# Patient Record
Sex: Female | Born: 1980 | Race: White | Hispanic: No | Marital: Married | Smoking: Never smoker
Health system: Southern US, Community
[De-identification: ages and names within clinical notes are randomized; demographics above are authoritative.]

## PROBLEM LIST (undated history)

## (undated) DIAGNOSIS — Z789 Other specified health status: Secondary | ICD-10-CM

## (undated) DIAGNOSIS — R569 Unspecified convulsions: Secondary | ICD-10-CM

## (undated) DIAGNOSIS — R51 Headache: Secondary | ICD-10-CM

---

## 2012-03-16 LAB — OB RESULTS CONSOLE HEPATITIS B SURFACE ANTIGEN: Hepatitis B Surface Ag: NEGATIVE

## 2012-06-20 ENCOUNTER — Encounter (HOSPITAL_COMMUNITY): Payer: Self-pay | Admitting: *Deleted

## 2012-06-20 ENCOUNTER — Inpatient Hospital Stay (HOSPITAL_COMMUNITY)
Admission: AD | Admit: 2012-06-20 | Discharge: 2012-06-20 | Disposition: A | Discharge status: Home / Self Care | Payer: BC Managed Care – PPO | Source: Ambulatory Visit | Attending: Obstetrics & Gynecology | Admitting: Obstetrics & Gynecology

## 2012-06-20 DIAGNOSIS — O99891 Other specified diseases and conditions complicating pregnancy: Secondary | ICD-10-CM | POA: Insufficient documentation

## 2012-06-20 DIAGNOSIS — R109 Unspecified abdominal pain: Secondary | ICD-10-CM | POA: Insufficient documentation

## 2012-06-20 DIAGNOSIS — O9A212 Injury, poisoning and certain other consequences of external causes complicating pregnancy, second trimester: Secondary | ICD-10-CM

## 2012-06-20 DIAGNOSIS — Y92410 Unspecified street and highway as the place of occurrence of the external cause: Secondary | ICD-10-CM | POA: Insufficient documentation

## 2012-06-20 HISTORY — DX: Other specified health status: Z78.9

## 2012-06-20 LAB — CBC
HCT: 34.9 % — ABNORMAL LOW (ref 36.0–46.0)
Hemoglobin: 12.4 g/dL (ref 12.0–15.0)
MCH: 30.7 pg (ref 26.0–34.0)
MCHC: 35.5 g/dL (ref 30.0–36.0)
MCV: 86.4 fL (ref 78.0–100.0)

## 2012-06-20 MED ORDER — ACETAMINOPHEN 500 MG PO TABS
1000.0000 mg | ORAL_TABLET | Freq: Four times a day (QID) | ORAL | Status: DC | PRN
  Administered 2012-06-20: 1000 mg via ORAL
  Filled 2012-06-20: qty 2

## 2012-06-20 NOTE — MAU Note (Signed)
Dr. Thad Ranger at the bedside.

## 2012-06-20 NOTE — MAU Note (Signed)
Dr. Ferry notified of pt.  
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
## 2012-06-20 NOTE — MAU Note (Signed)
Up to bathroom

## 2012-06-21 NOTE — MAU Provider Note (Signed)
Attestation of Attending Supervision of Advanced Practitioner (PA/CNM/NP): Evaluation and management procedures were performed by the Advanced Practitioner under my supervision and collaboration.  I have reviewed the Advanced Practitioner's note and chart, and I agree with the management and plan.  Cherylann Hobday, MD, FACOG Attending Obstetrician & Gynecologist Faculty Practice, [facility]  

## 2012-07-23 LAB — OB RESULTS CONSOLE HEPATITIS B SURFACE ANTIGEN
Hepatitis B Surface Ag: NEGATIVE
Hepatitis B Surface Ag: NEGATIVE
Hepatitis B Surface Ag: NEGATIVE
Hepatitis B Surface Ag: POSITIVE

## 2012-07-23 LAB — OB RESULTS CONSOLE ANTIBODY SCREEN: Antibody Screen: NEGATIVE

## 2012-07-23 LAB — OB RESULTS CONSOLE RUBELLA ANTIBODY, IGM: Rubella: NON-IMMUNE/NOT IMMUNE

## 2012-07-23 LAB — OB RESULTS CONSOLE RPR: RPR: NONREACTIVE

## 2012-07-24 LAB — OB RESULTS CONSOLE GC/CHLAMYDIA: Chlamydia: NEGATIVE

## 2012-09-15 ENCOUNTER — Inpatient Hospital Stay (HOSPITAL_COMMUNITY)
Admission: AD | Admit: 2012-09-15 | Discharge: 2012-09-20 | DRG: 651 | Disposition: A | Discharge status: Home / Self Care | Payer: BC Managed Care – PPO | Source: Ambulatory Visit | Attending: Obstetrics and Gynecology | Admitting: Obstetrics and Gynecology

## 2012-09-15 ENCOUNTER — Encounter (HOSPITAL_COMMUNITY): Payer: Self-pay | Admitting: *Deleted

## 2012-09-15 DIAGNOSIS — Z98891 History of uterine scar from previous surgery: Secondary | ICD-10-CM

## 2012-09-15 DIAGNOSIS — O321XX0 Maternal care for breech presentation, not applicable or unspecified: Secondary | ICD-10-CM | POA: Diagnosis present

## 2012-09-15 DIAGNOSIS — IMO0002 Reserved for concepts with insufficient information to code with codable children: Principal | ICD-10-CM | POA: Diagnosis present

## 2012-09-15 HISTORY — DX: Unspecified convulsions: R56.9

## 2012-09-15 HISTORY — DX: Headache: R51

## 2012-09-15 LAB — COMPREHENSIVE METABOLIC PANEL
ALT: 17 U/L (ref 0–35)
AST: 22 U/L (ref 0–37)
Albumin: 2.5 g/dL — ABNORMAL LOW (ref 3.5–5.2)
BUN: 15 mg/dL (ref 6–23)
CO2: 20 mEq/L (ref 19–32)
Chloride: 102 mEq/L (ref 96–112)
GFR calc Af Amer: >90 mL/min (ref >90)
GFR calc non Af Amer: >90 mL/min (ref >90)
Glucose, Bld: 82 mg/dL (ref 70–99)
Potassium: 4.1 mEq/L (ref 3.5–5.1)

## 2012-09-15 LAB — CBC
HCT: 36.7 % (ref 36.0–46.0)
Hemoglobin: 13 g/dL (ref 12.0–15.0)
MCH: 31.1 pg (ref 26.0–34.0)
MCHC: 35.4 g/dL (ref 30.0–36.0)
RBC: 4.18 MIL/uL (ref 3.87–5.11)

## 2012-09-15 LAB — URIC ACID: Uric Acid, Serum: 6.8 mg/dL (ref 2.4–7.0)

## 2012-09-15 MED ORDER — PRENATAL MULTIVITAMIN CH
1.0000 | ORAL_TABLET | Freq: Every day | ORAL | Status: DC
  Administered 2012-09-16: 1 via ORAL
  Filled 2012-09-15: qty 1

## 2012-09-15 MED ORDER — SODIUM CHLORIDE 0.9 % IJ SOLN
3.0000 mL | Freq: Two times a day (BID) | INTRAMUSCULAR | Status: DC
  Administered 2012-09-15 – 2012-09-19 (×4): 3 mL via INTRAVENOUS

## 2012-09-15 MED ORDER — DOCUSATE SODIUM 100 MG PO CAPS
100.0000 mg | ORAL_CAPSULE | Freq: Every day | ORAL | Status: DC
  Filled 2012-09-15: qty 1

## 2012-09-15 MED ORDER — SODIUM CHLORIDE 0.9 % IJ SOLN: 3.0000 mL | INTRAMUSCULAR | Status: DC | PRN

## 2012-09-15 MED ORDER — ZOLPIDEM TARTRATE 5 MG PO TABS: 5.0000 mg | ORAL_TABLET | Freq: Every evening | ORAL | Status: DC | PRN

## 2012-09-15 MED ORDER — ACETAMINOPHEN 325 MG PO TABS
650.0000 mg | ORAL_TABLET | ORAL | Status: DC | PRN
  Administered 2012-09-15 – 2012-09-17 (×5): 650 mg via ORAL
  Filled 2012-09-15 (×5): qty 2

## 2012-09-15 MED ORDER — CALCIUM CARBONATE ANTACID 500 MG PO CHEW: 2.0000 | CHEWABLE_TABLET | ORAL | Status: DC | PRN

## 2012-09-15 MED ORDER — OXYCODONE-ACETAMINOPHEN 5-325 MG PO TABS
1.0000 | ORAL_TABLET | Freq: Once | ORAL | Status: AC
  Administered 2012-09-15: 1 via ORAL
  Filled 2012-09-15: qty 1

## 2012-09-15 NOTE — Progress Notes (Signed)
18g hep lock inserted.

## 2012-09-15 NOTE — H&P (Signed)
Yolanda Garza is a 32 y.o. female G1P0 at 10 1/7 weeks (EDD 10/26/12 by LMP c/w 7 week [place]) presents for admission to observation with findings of increased BP to 160/100 in office today and 4+ proteinuria.  Pt has had a mild headache this pm, but no other PIH symptoms.  Fetal monitoring is reactive with no decelerations and her laboratory studies are normal with no HELLP syndrome.  Her prenatal care is significant for transfer to out group at [redacted] weeks gestation.  She had normal baseline BP's of 120/80.  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  She is rubella non-immune.  Maternal Medical History:  Contractions: Frequency: rare.   Perceived severity is mild.    Fetal activity: Perceived fetal activity is normal.    Prenatal complications: Pre-eclampsia.   Prenatal Complications - Diabetes: none.    OB History   Grav Para Term Preterm Abortions TAB SAB Ect Mult Living   1              Past Medical History  Diagnosis Date  . Medical history non-contributory   . Seizures     last 32 years old  . ZHYQMVHQ(469.6)    Past Surgical History  Procedure Laterality Date  . No past surgeries     Family History: family history is not on file. Social History:  reports that she has never smoked. She does not have any smokeless tobacco history on file. She reports that she does not drink alcohol or use illicit drugs.   Prenatal Transfer Tool  Maternal Diabetes: No Genetic Screening: Normal Maternal Ultrasounds/Referrals: Normal Fetal Ultrasounds or other Referrals:  None Maternal Substance Abuse:  No Significant Maternal Medications:  None Significant Maternal Lab Results:  None Other Comments:  None  Review of Systems  Gastrointestinal: Negative for heartburn and abdominal pain.  Neurological: Positive for headaches.      Blood pressure 154/82, pulse 91, temperature 98.7 F (37.1 C), temperature source Oral, resp. rate 18, height 5\' 2"  (1.575  m), weight 85.548 kg (188 lb 9.6 oz). Maternal Exam:  Uterine Assessment: Contraction strength is mild.  Contraction frequency is rare.   Abdomen: Patient reports no abdominal tenderness. Introitus: Normal vagina.    Physical Exam  Constitutional: She is oriented to person, place, and time. She appears well-developed and well-nourished.  Cardiovascular: Normal rate and regular rhythm.   Respiratory: Effort normal and breath sounds normal.  GI: Soft.  Genitourinary: Vagina normal and uterus normal.  Neurological: She is alert and oriented to person, place, and time.  Psychiatric: She has a normal mood and affect. Her behavior is normal.    Prenatal labs: ABO, Rh: --/--/O POS (03/29 1744) Antibody:  negative Rubella:  Nonimmune RPR:   Neg HBsAg:   Neg HIV:   Neg GBS:   Pending One hour GTT 124 Firsst trimester screen and AFP WNL  Assessment/Plan: The patient has begun a 24 hour urine collection.  Her labs are WNL and fetal status reassuring.  She will have a growth [place] in the AM.  BP are stable at 150/70-160/90's.  A GBS was collected in the office and is pending.  We will repeat labs in AM, allow pt to eat.   Oliver Pila 09/15/2012, 8:39 PM

## 2012-09-16 ENCOUNTER — Encounter (HOSPITAL_COMMUNITY): Payer: Self-pay | Admitting: *Deleted

## 2012-09-16 ENCOUNTER — Inpatient Hospital Stay (HOSPITAL_COMMUNITY): Payer: BC Managed Care – PPO

## 2012-09-16 LAB — COMPREHENSIVE METABOLIC PANEL
ALT: 14 U/L (ref 0–35)
Albumin: 2.1 g/dL — ABNORMAL LOW (ref 3.5–5.2)
Alkaline Phosphatase: 96 U/L (ref 39–117)
Calcium: 8.8 mg/dL (ref 8.4–10.5)
Potassium: 4.3 mEq/L (ref 3.5–5.1)
Sodium: 137 mEq/L (ref 135–145)
Total Protein: 5.5 g/dL — ABNORMAL LOW (ref 6.0–8.3)

## 2012-09-16 LAB — CREATININE CLEARANCE, URINE, 24 HOUR
Collection Interval-CRCL: 24 hours
Creatinine, 24H Ur: 1480 mg/d (ref 700–1800)
Urine Total Volume-CRCL: 1400 mL

## 2012-09-16 LAB — PROTEIN, URINE, 24 HOUR
Collection Interval-UPROT: 24 hours
Protein, 24H Urine: 2870 mg/d — ABNORMAL HIGH (ref 50–100)
Urine Total Volume-UPROT: 1400 mL

## 2012-09-16 LAB — CBC
MCHC: 34.5 g/dL (ref 30.0–36.0)
RDW: 13.8 % (ref 11.5–15.5)

## 2012-09-16 NOTE — Progress Notes (Signed)
Ur chart review completed.  

## 2012-09-16 NOTE — Progress Notes (Signed)
Patient ID: Yolanda Garza, female   DOB: [birth year], 30 y.o.   MRN: [id number]  No PIH sx's.  +FM.  Feeling well, coping with being here  AF BP elevated 140-150's/90-100  gen NAD  D/w pt 24 hr urine result 2.8gm protein = mild preeclampsia. D/w pt POC, close monitoring, plan for delivery before or at 37 weeks. Pt voices understanding, desires NICU consult

## 2012-09-16 NOTE — Consult Note (Signed)
Asked by Dr. Ellyn Hack to provide prenatal consultation for patient at risk for preterm delivery due to preeclampsia.  Mother is 32y.o. G1 who is now 34 2/[redacted] weeks EGA, with pregnancy complicated by hypertension and proteinuria.   Discussed usual expectations for preterm infant at [redacted] weeks gestation, including possible needs for respiratory support, IV fluids, incubator temp support, and possible length of stay in NICU until 37 - 40 wks PCA.  She plans to bottle feed.  Patient was attentive, had appropriate questions, and was appreciative of my input.  Thank you consulting Neonatology.  Korby Ratay E. Barrie Dunker., MD    Total time (and face-to-face time) 20 minutes

## 2012-09-16 NOTE — Progress Notes (Signed)
Patient ID: Yolanda Garza, female   DOB: 02/27/1981, 32 y.o.   MRN: [id number] HD #2  34+ weeks/preeclampsia Pt had headache last pm that resolved with one percocet, but did take awhile. She feels ok this AM but did not sleep too well being in hospital  BP140-150/90-100 Labs this AM still WNL 24 hour urine in progress  Fundus NT FHR reassuring on NST intermittently [place] for growth this AM.

## 2012-09-17 ENCOUNTER — Encounter (HOSPITAL_COMMUNITY): Payer: Self-pay | Admitting: Anesthesiology

## 2012-09-17 ENCOUNTER — Encounter (HOSPITAL_COMMUNITY)
Admission: AD | Disposition: A | Discharge status: Home / Self Care | Payer: Self-pay | Source: Ambulatory Visit | Attending: Obstetrics and Gynecology

## 2012-09-17 ENCOUNTER — Inpatient Hospital Stay (HOSPITAL_COMMUNITY): Payer: BC Managed Care – PPO | Admitting: Anesthesiology

## 2012-09-17 LAB — COMPREHENSIVE METABOLIC PANEL
ALT: 22 U/L (ref 0–35)
AST: 23 U/L (ref 0–37)
Albumin: 2.1 g/dL — ABNORMAL LOW (ref 3.5–5.2)
CO2: 24 mEq/L (ref 19–32)
Calcium: 9 mg/dL (ref 8.4–10.5)
Creatinine, Ser: 0.83 mg/dL (ref 0.50–1.10)
GFR calc non Af Amer: >90 mL/min (ref >90)
Sodium: 137 mEq/L (ref 135–145)
Total Protein: 5.8 g/dL — ABNORMAL LOW (ref 6.0–8.3)

## 2012-09-17 LAB — CBC WITH DIFFERENTIAL/PLATELET
Basophils Absolute: 0 10*3/uL (ref 0.0–0.1)
Basophils Relative: 0 % (ref 0–1)
Eosinophils Relative: 1 % (ref 0–5)
Lymphocytes Relative: 17 % (ref 12–46)
MCHC: 34.5 g/dL (ref 30.0–36.0)
MCV: 89.2 fL (ref 78.0–100.0)
Monocytes Absolute: 0.6 10*3/uL (ref 0.1–1.0)
Platelets: 144 10*3/uL — ABNORMAL LOW (ref 150–400)
RDW: 13.7 % (ref 11.5–15.5)
WBC: 10.3 10*3/uL (ref 4.0–10.5)

## 2012-09-17 LAB — CBC
MCV: 89 fL (ref 78.0–100.0)
Platelets: 176 10*3/uL (ref 150–400)
RBC: 4.1 MIL/uL (ref 3.87–5.11)
RDW: 13.6 % (ref 11.5–15.5)
WBC: 13.1 10*3/uL — ABNORMAL HIGH (ref 4.0–10.5)

## 2012-09-17 SURGERY — Surgical Case
Anesthesia: Regional | Site: Abdomen | Wound class: Clean Contaminated

## 2012-09-17 MED ORDER — ONDANSETRON HCL 4 MG/2ML IJ SOLN: 4.0000 mg | Freq: Three times a day (TID) | INTRAMUSCULAR | Status: DC | PRN

## 2012-09-17 MED ORDER — WITCH HAZEL-GLYCERIN EX PADS: 1.0000 "application " | MEDICATED_PAD | CUTANEOUS | Status: DC | PRN

## 2012-09-17 MED ORDER — MORPHINE SULFATE 0.5 MG/ML IJ SOLN
INTRAMUSCULAR | Status: AC
  Filled 2012-09-17: qty 10

## 2012-09-17 MED ORDER — MENTHOL 3 MG MT LOZG
1.0000 | LOZENGE | OROMUCOSAL | Status: DC | PRN
  Filled 2012-09-17: qty 9

## 2012-09-17 MED ORDER — METOCLOPRAMIDE HCL 5 MG/ML IJ SOLN: 10.0000 mg | Freq: Three times a day (TID) | INTRAMUSCULAR | Status: DC | PRN

## 2012-09-17 MED ORDER — PRENATAL MULTIVITAMIN CH
1.0000 | ORAL_TABLET | Freq: Every day | ORAL | Status: DC
  Administered 2012-09-18 – 2012-09-19 (×2): 1 via ORAL
  Filled 2012-09-17 (×2): qty 1

## 2012-09-17 MED ORDER — ZOLPIDEM TARTRATE 5 MG PO TABS: 5.0000 mg | ORAL_TABLET | Freq: Every evening | ORAL | Status: DC | PRN

## 2012-09-17 MED ORDER — ACETAMINOPHEN 10 MG/ML IV SOLN: 1000.0000 mg | Freq: Four times a day (QID) | INTRAVENOUS | Status: AC | PRN

## 2012-09-17 MED ORDER — MAGNESIUM SULFATE 40 G IN LACTATED RINGERS - SIMPLE
2.0000 g/h | INTRAVENOUS | Status: DC
  Administered 2012-09-17: 2 g/h via INTRAVENOUS
  Filled 2012-09-17 (×2): qty 500

## 2012-09-17 MED ORDER — LACTATED RINGERS IV SOLN
INTRAVENOUS | Status: DC
  Administered 2012-09-17: 23:00:00 via INTRAVENOUS

## 2012-09-17 MED ORDER — TETANUS-DIPHTH-ACELL PERTUSSIS 5-2.5-18.5 LF-MCG/0.5 IM SUSP
0.5000 mL | Freq: Once | INTRAMUSCULAR | Status: DC
  Filled 2012-09-17: qty 0.5

## 2012-09-17 MED ORDER — MORPHINE SULFATE (PF) 0.5 MG/ML IJ SOLN
INTRAMUSCULAR | Status: DC | PRN
  Administered 2012-09-17: .15 mg via INTRATHECAL

## 2012-09-17 MED ORDER — OXYTOCIN 40 UNITS IN LACTATED RINGERS INFUSION - SIMPLE MED
62.5000 mL/h | INTRAVENOUS | Status: AC
  Administered 2012-09-17: 62.5 mL/h via INTRAVENOUS

## 2012-09-17 MED ORDER — NALOXONE HCL 1 MG/ML IJ SOLN: 1.0000 ug/kg/h | INTRAVENOUS | Status: DC | PRN

## 2012-09-17 MED ORDER — MEASLES, MUMPS & RUBELLA VAC ~~LOC~~ INJ
0.5000 mL | INJECTION | Freq: Once | SUBCUTANEOUS | Status: AC
  Administered 2012-09-18: 0.5 mL via SUBCUTANEOUS
  Filled 2012-09-17: qty 0.5

## 2012-09-17 MED ORDER — OXYCODONE-ACETAMINOPHEN 5-325 MG PO TABS
1.0000 | ORAL_TABLET | ORAL | Status: DC | PRN
  Administered 2012-09-17 – 2012-09-19 (×5): 2 via ORAL
  Administered 2012-09-19 (×2): 1 via ORAL
  Administered 2012-09-19 – 2012-09-20 (×2): 2 via ORAL
  Filled 2012-09-17 (×2): qty 2
  Filled 2012-09-17 (×2): qty 1
  Filled 2012-09-17: qty 2
  Filled 2012-09-17: qty 1
  Filled 2012-09-17 (×4): qty 2

## 2012-09-17 MED ORDER — METOCLOPRAMIDE HCL 5 MG/ML IJ SOLN
INTRAMUSCULAR | Status: DC | PRN
  Administered 2012-09-17: 10 mg via INTRAVENOUS

## 2012-09-17 MED ORDER — IBUPROFEN 600 MG PO TABS
600.0000 mg | ORAL_TABLET | Freq: Four times a day (QID) | ORAL | Status: DC
  Administered 2012-09-17 – 2012-09-20 (×10): 600 mg via ORAL
  Filled 2012-09-17 (×10): qty 1

## 2012-09-17 MED ORDER — BUTORPHANOL TARTRATE 1 MG/ML IJ SOLN
1.0000 mg | Freq: Once | INTRAMUSCULAR | Status: AC
  Administered 2012-09-17: 1 mg via INTRAVENOUS
  Filled 2012-09-17: qty 1

## 2012-09-17 MED ORDER — ONDANSETRON HCL 4 MG/2ML IJ SOLN: 4.0000 mg | INTRAMUSCULAR | Status: DC | PRN

## 2012-09-17 MED ORDER — CEFAZOLIN SODIUM-DEXTROSE 2-3 GM-% IV SOLR
2.0000 g | Freq: Once | INTRAVENOUS | Status: AC
  Administered 2012-09-17: 2 g via INTRAVENOUS
  Filled 2012-09-17: qty 50

## 2012-09-17 MED ORDER — KETOROLAC TROMETHAMINE 60 MG/2ML IM SOLN: 60.0000 mg | Freq: Once | INTRAMUSCULAR | Status: AC | PRN

## 2012-09-17 MED ORDER — LACTATED RINGERS IV SOLN
INTRAVENOUS | Status: DC | PRN
  Administered 2012-09-17: 18:00:00 via INTRAVENOUS

## 2012-09-17 MED ORDER — SENNOSIDES-DOCUSATE SODIUM 8.6-50 MG PO TABS
2.0000 | ORAL_TABLET | Freq: Every day | ORAL | Status: DC
  Administered 2012-09-17 – 2012-09-19 (×3): 2 via ORAL

## 2012-09-17 MED ORDER — LACTATED RINGERS IV SOLN
40.0000 [IU] | INTRAVENOUS | Status: DC | PRN
  Administered 2012-09-17: 40 [IU] via INTRAVENOUS

## 2012-09-17 MED ORDER — FENTANYL CITRATE 0.05 MG/ML IJ SOLN
INTRAMUSCULAR | Status: DC | PRN
  Administered 2012-09-17: 25 ug via INTRATHECAL

## 2012-09-17 MED ORDER — SODIUM CHLORIDE 0.9 % IJ SOLN
3.0000 mL | INTRAMUSCULAR | Status: DC | PRN
  Administered 2012-09-18: 3 mL via INTRAVENOUS

## 2012-09-17 MED ORDER — ONDANSETRON HCL 4 MG PO TABS: 4.0000 mg | ORAL_TABLET | ORAL | Status: DC | PRN

## 2012-09-17 MED ORDER — NALBUPHINE HCL 10 MG/ML IJ SOLN: 5.0000 mg | INTRAMUSCULAR | Status: DC | PRN

## 2012-09-17 MED ORDER — BUPIVACAINE IN DEXTROSE 0.75-8.25 % IT SOLN
INTRATHECAL | Status: DC | PRN
  Administered 2012-09-17: 1.4 mL via INTRATHECAL

## 2012-09-17 MED ORDER — MAGNESIUM SULFATE BOLUS VIA INFUSION
4.0000 g | Freq: Once | INTRAVENOUS | Status: AC
  Administered 2012-09-17: 4 g via INTRAVENOUS
  Filled 2012-09-17: qty 500

## 2012-09-17 MED ORDER — ONDANSETRON HCL 4 MG/2ML IJ SOLN
INTRAMUSCULAR | Status: AC
  Filled 2012-09-17: qty 2

## 2012-09-17 MED ORDER — KETOROLAC TROMETHAMINE 30 MG/ML IJ SOLN
30.0000 mg | Freq: Four times a day (QID) | INTRAMUSCULAR | Status: AC | PRN
  Filled 2012-09-17: qty 1

## 2012-09-17 MED ORDER — LANOLIN HYDROUS EX OINT: 1.0000 "application " | TOPICAL_OINTMENT | CUTANEOUS | Status: DC | PRN

## 2012-09-17 MED ORDER — ONDANSETRON HCL 4 MG/2ML IJ SOLN
INTRAMUSCULAR | Status: DC | PRN
  Administered 2012-09-17: 4 mg via INTRAVENOUS

## 2012-09-17 MED ORDER — CITRIC ACID-SODIUM CITRATE 334-500 MG/5ML PO SOLN
ORAL | Status: AC
  Administered 2012-09-17: 30 mL
  Filled 2012-09-17: qty 15

## 2012-09-17 MED ORDER — LACTATED RINGERS IV SOLN: INTRAVENOUS | Status: DC

## 2012-09-17 MED ORDER — FENTANYL CITRATE 0.05 MG/ML IJ SOLN
INTRAMUSCULAR | Status: AC
  Filled 2012-09-17: qty 2

## 2012-09-17 MED ORDER — CEFAZOLIN SODIUM-DEXTROSE 2-3 GM-% IV SOLR
2.0000 g | Freq: Three times a day (TID) | INTRAVENOUS | Status: AC
  Administered 2012-09-17 – 2012-09-18 (×2): 2 g via INTRAVENOUS
  Filled 2012-09-17 (×2): qty 50

## 2012-09-17 MED ORDER — MEPERIDINE HCL 25 MG/ML IJ SOLN: 6.2500 mg | INTRAMUSCULAR | Status: DC | PRN

## 2012-09-17 MED ORDER — SCOPOLAMINE 1 MG/3DAYS TD PT72
MEDICATED_PATCH | TRANSDERMAL | Status: AC
  Administered 2012-09-17: 1.5 mg via TRANSDERMAL
  Filled 2012-09-17: qty 1

## 2012-09-17 MED ORDER — HYDROMORPHONE HCL PF 1 MG/ML IJ SOLN: 0.2500 mg | INTRAMUSCULAR | Status: DC | PRN

## 2012-09-17 MED ORDER — SCOPOLAMINE 1 MG/3DAYS TD PT72: 1.0000 | MEDICATED_PATCH | Freq: Once | TRANSDERMAL | Status: DC

## 2012-09-17 MED ORDER — SIMETHICONE 80 MG PO CHEW
80.0000 mg | CHEWABLE_TABLET | ORAL | Status: DC | PRN
  Administered 2012-09-18: 80 mg via ORAL

## 2012-09-17 MED ORDER — NALOXONE HCL 0.4 MG/ML IJ SOLN: 0.4000 mg | INTRAMUSCULAR | Status: DC | PRN

## 2012-09-17 MED ORDER — OXYTOCIN 10 UNIT/ML IJ SOLN
INTRAMUSCULAR | Status: AC
  Filled 2012-09-17: qty 4

## 2012-09-17 MED ORDER — DIBUCAINE 1 % RE OINT: 1.0000 "application " | TOPICAL_OINTMENT | RECTAL | Status: DC | PRN

## 2012-09-17 MED ORDER — SIMETHICONE 80 MG PO CHEW
80.0000 mg | CHEWABLE_TABLET | Freq: Three times a day (TID) | ORAL | Status: DC
  Administered 2012-09-17 – 2012-09-20 (×6): 80 mg via ORAL

## 2012-09-17 MED ORDER — EPHEDRINE SULFATE 50 MG/ML IJ SOLN
INTRAMUSCULAR | Status: DC | PRN
  Administered 2012-09-17 (×2): 10 mg via INTRAVENOUS

## 2012-09-17 MED ORDER — KETOROLAC TROMETHAMINE 60 MG/2ML IM SOLN
INTRAMUSCULAR | Status: AC
  Administered 2012-09-17: 60 mg via INTRAMUSCULAR
  Filled 2012-09-17: qty 2

## 2012-09-17 MED ORDER — EPHEDRINE 5 MG/ML INJ
INTRAVENOUS | Status: AC
  Filled 2012-09-17: qty 10

## 2012-09-17 SURGICAL SUPPLY — 31 items
CLAMP CORD UMBIL (MISCELLANEOUS) IMPLANT
CLOTH BEACON ORANGE TIMEOUT ST (SAFETY) ×2 IMPLANT
CONTAINER PREFILL 10% NBF 15ML (MISCELLANEOUS) IMPLANT
DRAPE LG THREE QUARTER DISP (DRAPES) ×2 IMPLANT
DRSG OPSITE POSTOP 4X10 (GAUZE/BANDAGES/DRESSINGS) ×2 IMPLANT
DRSG VASELINE 3X18 (GAUZE/BANDAGES/DRESSINGS) ×2 IMPLANT
DURAPREP 26ML APPLICATOR (WOUND CARE) ×2 IMPLANT
ELECT REM PT RETURN 9FT ADLT (ELECTROSURGICAL) ×2
ELECTRODE REM PT RTRN 9FT ADLT (ELECTROSURGICAL) ×1 IMPLANT
EXTRACTOR VACUUM KIWI (MISCELLANEOUS) IMPLANT
EXTRACTOR VACUUM M CUP 4 TUBE (SUCTIONS) IMPLANT
GLOVE BIO SURGEON STRL SZ7.5 (GLOVE) ×2 IMPLANT
GOWN PREVENTION PLUS XLARGE (GOWN DISPOSABLE) ×2 IMPLANT
GOWN STRL REIN XL XLG (GOWN DISPOSABLE) ×4 IMPLANT
KIT ABG SYR 3ML LUER SLIP (SYRINGE) IMPLANT
NEEDLE HYPO 25X5/8 SAFETYGLIDE (NEEDLE) IMPLANT
NS IRRIG 1000ML POUR BTL (IV SOLUTION) ×2 IMPLANT
PACK C SECTION WH (CUSTOM PROCEDURE TRAY) ×2 IMPLANT
PAD OB MATERNITY 4.3X12.25 (PERSONAL CARE ITEMS) ×2 IMPLANT
RTRCTR C-SECT PINK 25CM LRG (MISCELLANEOUS) IMPLANT
STAPLER VISISTAT 35W (STAPLE) IMPLANT
SUT PLAIN 0 NONE (SUTURE) IMPLANT
SUT VIC AB 0 CT1 36 (SUTURE) ×16 IMPLANT
SUT VIC AB 3-0 CTX 36 (SUTURE) ×2 IMPLANT
SUT VIC AB 3-0 SH 27 (SUTURE)
SUT VIC AB 3-0 SH 27X BRD (SUTURE) IMPLANT
SUT VIC AB 4-0 KS 27 (SUTURE) IMPLANT
SUT VICRYL 0 TIES 12 18 (SUTURE) IMPLANT
TOWEL OR 17X24 6PK STRL BLUE (TOWEL DISPOSABLE) ×6 IMPLANT
TRAY FOLEY CATH 14FR (SET/KITS/TRAYS/PACK) ×2 IMPLANT
WATER STERILE IRR 1000ML POUR (IV SOLUTION) ×2 IMPLANT

## 2012-09-17 NOTE — Transfer of Care (Signed)
Immediate Anesthesia Transfer of Care Note  Patient: Yolanda Garza  Procedure(s) Performed: Procedure(s): CESAREAN SECTION (N/A)  Patient Location: PACU  Anesthesia Type:Spinal  Level of Consciousness: awake, alert  and oriented  Airway & Oxygen Therapy: Patient Spontanous Breathing  Post-op Assessment: Report given to PACU RN and Post -op Vital signs reviewed and stable  Post vital signs: Reviewed and stable  Complications: No apparent anesthesia complications

## 2012-09-17 NOTE — Progress Notes (Signed)
Patient ID: Yolanda Garza, female   DOB: Mar 23, 1981, 32 y.o.   MRN: [id number] I spoke to Dr. Harlon Flor and he feels the pt should be delivered so I will schedule her for c section. He also advises starting her on Magnesium sulfate. I will inform the pt.

## 2012-09-17 NOTE — Anesthesia Preprocedure Evaluation (Addendum)
Anesthesia Evaluation  Patient identified by MRN, date of birth, ID band Patient awake    Reviewed: Allergy & Precautions, H&P , Patient's Chart, lab work & pertinent test results  Airway Mallampati: IV TM Distance: >3 FB Neck ROM: Limited  Mouth opening: Limited Mouth Opening  Dental no notable dental hx.    Pulmonary  breath sounds clear to auscultation  Pulmonary exam normal       Cardiovascular Exercise Tolerance: Good hypertension, Rhythm:regular Rate:Normal     Neuro/Psych    GI/Hepatic   Endo/Other    Renal/GU      Musculoskeletal   Abdominal   Peds  Hematology   Anesthesia Other Findings   Reproductive/Obstetrics                          Anesthesia Physical Anesthesia Plan  ASA: II  Anesthesia Plan: Spinal   Post-op Pain Management:    Induction:   Airway Management Planned:   Additional Equipment:   Intra-op Plan:   Post-operative Plan:   Informed Consent: I have reviewed the patients History and Physical, chart, labs and discussed the procedure including the risks, benefits and alternatives for the proposed anesthesia with the patient or authorized representative who has indicated his/her understanding and acceptance.   Dental Advisory Given  Plan Discussed with: CRNA  Anesthesia Plan Comments: (Lab work confirmed with CRNA in room. Platelets okay. Discussed spinal anesthetic, and patient consents to the procedure:  included risk of possible headache,backache, failed block, allergic reaction, and nerve injury. This patient was asked if she had any questions or concerns before the procedure started. )        Anesthesia Quick Evaluation

## 2012-09-17 NOTE — Op Note (Signed)
NAMECARLIE, Yolanda Garza NO.:  [id number]  MEDICAL RECORD NO.:  [id number]  LOCATION:  [facility]                          FACILITY:  [facility]  PHYSICIAN:  Malachi Pro. Ambrose Mantle, M.D. DATE OF BIRTH:  1980/06/17  DATE OF PROCEDURE: DATE OF DISCHARGE:  06/20/2012                              OPERATIVE REPORT   PREOPERATIVE DIAGNOSIS:  Intrauterine pregnancy, 34 weeks 3 days, preeclampsia manifested by a 2.87 g of protein in a 24-hour urine, significantly high blood pressure with diastolic blood pressure of greater than 105 twice in the last 24 hours.  Headache, scotomata, falling platelet count from 170,000 to 144,000, breech presentation.  POSTOPERATIVE DIAGNOSIS:  Intrauterine pregnancy, 34 weeks 3 days, preeclampsia manifested by a 2.87 g of protein in a 24-hour urine, significantly high blood pressure with diastolic blood pressure of greater than 105 twice in the last 24 hours.  Headache, scotomata, falling platelet count from 170,000 to 144,000, breech presentation.  OPERATION:  Low-transverse cervical cesarean section.  OPERATOR:  Hetty Linhart.  ANESTHESIA:  Spinal anesthesia by Dr. Malen Gauze.  PROCEDURE DESCRIPTION:  The patient was brought to the operating room and given a spinal anesthetic by Dr. Malen Gauze.  She was placed supine. The abdomen was prepped with DuraPrep after the urethra was prepped with Betadine and a Foley catheter was inserted to straight drain.  After 3 minutes of drying, the procedure was noted to be performed.  A time-out was done.  The abdomen was draped as a sterile field.  Anesthesia was confirmed and a transverse incision was made above the pubis, carried in layers through the skin, subcutaneous tissue, and fascia.  The fascia was separated from the rectus muscles superiorly and inferiorly.  The rectus muscle was split in the midline.  Peritoneum opened vertically and stretched.  The lower uterine segment was exposed with a Engineer, manufacturing.  A short  transverse incision was made through the superficial layers of the myometrium.  I went the rest away into the amniotic sac with my finger.  Clear fluid was obtained.  The uterine incision was stretched by pulling superiorly and inferiorly.  The baby was in breech presentation.  I delivered the buttocks without difficulty, delivered the rest of the body.  The arms and head came out easily.  The nose and pharynx was suctioned before the head was delivered.  The cord was clamped.  The infant was given to the neonatologist who was in attendance.  A pH was preserved in case it was felt necessary.  I do not know if it was sent.  The neonatologist did not come into the room and asked about it.  Routine cord blood studies were obtained.  The placenta was removed.  The inside of the uterus was inspected, found to be free of any debris.  The cervix was dilated with a ring forceps.  The uterine incision was then closed in 2 layers using a running lock suture of 0 Vicryl on the first layer, nonlocking suture of the same material on the second layer.  One extra figure-of-eight suture was required for hemostasis.  The uterus, tubes and ovaries appeared normal.  The omentum and small bowel kept entering  the operative field.  I liberally irrigated the pelvic cavity.  Used a sponge to block both gutters, found no bleeding.  The incision was dry.  The sponge was removed and the abdominal wall was closed in layers using interrupted sutures of 0 Vicryl to close the peritoneum and rectus muscle in 1 layer, two running sutures of 0 Vicryl on the fascia, running 3-0 Vicryl on the subcutaneous tissue and staples on the skin. A waffle dressing was applied over Vaseline gauze and the procedure was terminated.  Blood loss was estimated by the nurse anesthetist is 600 mL.  Sponge and needle counts were correct.  She was returned to recovery in satisfactory condition.     Malachi Pro. Ambrose Mantle, M.D.     TFH/MEDQ   D:  09/17/2012  T:  09/17/2012  Job:  [id number]

## 2012-09-17 NOTE — Anesthesia Procedure Notes (Signed)
Spinal  Patient location during procedure: OR Start time: 09/17/2012 5:55 PM Staffing Anesthesiologist: FOSTER, MICHAEL A. Performed by: anesthesiologist  Preanesthetic Checklist Completed: patient identified, site marked, surgical consent, pre-op evaluation, timeout performed, IV checked, risks and benefits discussed and monitors and equipment checked Spinal Block Patient position: sitting Prep: DuraPrep Patient monitoring: heart rate, cardiac monitor, continuous pulse ox and blood pressure Approach: midline Location: L3-4 Injection technique: single-shot Needle Needle type: Sprotte  Needle gauge: 24 G Needle length: 9 cm Needle insertion depth: 6 cm Assessment Sensory level: T4 Additional Notes Patient tolerated procedure well. Adequate sensory level.

## 2012-09-17 NOTE — Anesthesia Postprocedure Evaluation (Signed)
  Anesthesia Post Note  Patient: Yolanda Garza  Procedure(s) Performed: Procedure(s) (LRB): CESAREAN SECTION (N/A)  Anesthesia type: Spinal  Patient location: PACU  Post pain: Pain level controlled  Post assessment: Post-op Vital signs reviewed  Last Vitals:  Filed Vitals:   09/17/12 1915  BP: 144/88  Pulse: 96  Temp:   Resp: 18    Post vital signs: Reviewed  Level of consciousness: awake  Complications: No apparent anesthesia complications

## 2012-09-17 NOTE — Progress Notes (Signed)
Patient ID: Yolanda Garza, female   DOB: Dec 08, 1980, 32 y.o.   MRN: [id number] Pt has headache and sees spots in her visual field. Her platelet count is trending down and her BP is consistently elevated The fetal presentation is breech. I will consult with MFM regarding delivery. Her 24 hour urine was 2.87 grams

## 2012-09-17 NOTE — Progress Notes (Signed)
Patient ID: Yolanda Garza, female   DOB: [birth year], 32 y.o.   MRN: [id number] Bedside US confirms breech presentation. Pt advise low vertical incision may be necessary

## 2012-09-18 ENCOUNTER — Encounter (HOSPITAL_COMMUNITY): Payer: Self-pay | Admitting: *Deleted

## 2012-09-18 LAB — CBC WITH DIFFERENTIAL/PLATELET
Basophils Absolute: 0 10*3/uL (ref 0.0–0.1)
Basophils Relative: 0 % (ref 0–1)
Eosinophils Absolute: 0.1 10*3/uL (ref 0.0–0.7)
Eosinophils Relative: 0 % (ref 0–5)
Hemoglobin: 12 g/dL (ref 12.0–15.0)
Lymphs Abs: 1.7 10*3/uL (ref 0.7–4.0)
MCH: 30.9 pg (ref 26.0–34.0)
MCHC: 34.6 g/dL (ref 30.0–36.0)
MCV: 89.4 fL (ref 78.0–100.0)
Monocytes Absolute: 0.5 10*3/uL (ref 0.1–1.0)
Neutro Abs: 10.6 10*3/uL — ABNORMAL HIGH (ref 1.7–7.7)
Neutrophils Relative %: 82 % — ABNORMAL HIGH (ref 43–77)
Platelets: 142 10*3/uL — ABNORMAL LOW (ref 150–400)
RBC: 3.88 MIL/uL (ref 3.87–5.11)
RDW: 13.8 % (ref 11.5–15.5)

## 2012-09-18 LAB — COMPREHENSIVE METABOLIC PANEL
AST: 31 U/L (ref 0–37)
Albumin: 2.1 g/dL — ABNORMAL LOW (ref 3.5–5.2)
Alkaline Phosphatase: 87 U/L (ref 39–117)
BUN: 16 mg/dL (ref 6–23)
Chloride: 97 mEq/L (ref 96–112)
Potassium: 4.5 mEq/L (ref 3.5–5.1)
Total Bilirubin: 0.1 mg/dL — ABNORMAL LOW (ref 0.3–1.2)
Total Protein: 5.6 g/dL — ABNORMAL LOW (ref 6.0–8.3)

## 2012-09-18 NOTE — Progress Notes (Signed)
BP stable, diuresing, will d/c Magnesium 

## 2012-09-18 NOTE — Lactation Note (Signed)
This note was copied from the chart of Yolanda Kambryn Dapolito. Lactation Consultation Note   MOm has history of very low milk supply, and has chosen to formula feed  Patient Name: Yolanda Garza Date: 09/18/2012 Reason for consult: Other (Comment) (exclusion)   Maternal Data Formula Feeding for Exclusion: Yes Reason for exclusion: Mother's choice to forumla feed on admision  Feeding Feeding Type: Formula Feeding method: Tube/Gavage Length of feed: 10 min  LATCH Score/Interventions                      Lactation Tools Discussed/Used     Consult Status      Alfred Levins 09/18/2012, 2:08 PM

## 2012-09-18 NOTE — Progress Notes (Signed)
Patient ID: Yolanda Garza, female   DOB: 11-20-1980, 32 y.o.   MRN: [id number] #1 afebrile BP much improved Output has not picked up yet HGB stable Platelets minimally lower and LFT' normal Will continue Magnesium sulfate

## 2012-09-18 NOTE — Progress Notes (Signed)
I visited with Mileidy and her husband, Elijah Birk, while making rounds on the unit.  They were in good spirits, though very tired.  They reported that their baby was doing well and that she was feisty.  They are aware of on-going availability of chaplain support.    [employer] Pager, [phone] 2:20 PM   09/18/12 1400  Clinical Encounter Type  Visited With Patient and family together  Visit Type Initial

## 2012-09-19 LAB — CBC WITH DIFFERENTIAL/PLATELET
Basophils Absolute: 0 10*3/uL (ref 0.0–0.1)
Basophils Relative: 0 % (ref 0–1)
Eosinophils Absolute: 0.1 10*3/uL (ref 0.0–0.7)
Hemoglobin: 11.5 g/dL — ABNORMAL LOW (ref 12.0–15.0)
MCH: 30.9 pg (ref 26.0–34.0)
MCHC: 34.1 g/dL (ref 30.0–36.0)
Monocytes Relative: 8 % (ref 3–12)
Neutro Abs: 9.8 10*3/uL — ABNORMAL HIGH (ref 1.7–7.7)
Neutrophils Relative %: 80 % — ABNORMAL HIGH (ref 43–77)
Platelets: 160 10*3/uL (ref 150–400)
RDW: 14.3 % (ref 11.5–15.5)

## 2012-09-19 LAB — COMPREHENSIVE METABOLIC PANEL
AST: 27 U/L (ref 0–37)
Albumin: 2 g/dL — ABNORMAL LOW (ref 3.5–5.2)
Alkaline Phosphatase: 74 U/L (ref 39–117)
BUN: 13 mg/dL (ref 6–23)
Chloride: 104 mEq/L (ref 96–112)
Potassium: 4.3 mEq/L (ref 3.5–5.1)
Total Protein: 5.6 g/dL — ABNORMAL LOW (ref 6.0–8.3)

## 2012-09-19 NOTE — Progress Notes (Signed)
POD #2 Feels ok, sore Afeb, VSS, BP 130-160/70-90 Abd- soft, fundus firm, dressing intact Continue routine care, monitor BP to see if needs to be treated

## 2012-09-20 DIAGNOSIS — Z98891 History of uterine scar from previous surgery: Secondary | ICD-10-CM

## 2012-09-20 LAB — CBC WITH DIFFERENTIAL/PLATELET
Eosinophils Absolute: 0.1 10*3/uL (ref 0.0–0.7)
Eosinophils Relative: 1 % (ref 0–5)
HCT: 33.7 % — ABNORMAL LOW (ref 36.0–46.0)
Lymphocytes Relative: 13 % (ref 12–46)
Lymphs Abs: 1.6 10*3/uL (ref 0.7–4.0)
MCH: 31.2 pg (ref 26.0–34.0)
MCV: 91.3 fL (ref 78.0–100.0)
Monocytes Absolute: 1 10*3/uL (ref 0.1–1.0)
RBC: 3.69 MIL/uL — ABNORMAL LOW (ref 3.87–5.11)
RDW: 14.3 % (ref 11.5–15.5)
WBC: 12.4 10*3/uL — ABNORMAL HIGH (ref 4.0–10.5)

## 2012-09-20 LAB — COMPREHENSIVE METABOLIC PANEL
CO2: 26 mEq/L (ref 19–32)
Calcium: 8.2 mg/dL — ABNORMAL LOW (ref 8.4–10.5)
Creatinine, Ser: 0.8 mg/dL (ref 0.50–1.10)
GFR calc Af Amer: >90 mL/min (ref >90)
GFR calc non Af Amer: >90 mL/min (ref >90)
Glucose, Bld: 107 mg/dL — ABNORMAL HIGH (ref 70–99)

## 2012-09-20 MED ORDER — OXYCODONE-ACETAMINOPHEN 5-325 MG PO TABS: 1.0000 | ORAL_TABLET | ORAL | Status: AC | PRN

## 2012-09-20 NOTE — Discharge Summary (Signed)
Obstetric Discharge Summary Reason for Admission: observation/evaluation Prenatal Procedures: NST Intrapartum Procedures: cesarean: low cervical, transverse Postpartum Procedures: none Complications-Operative and Postpartum: none Hemoglobin  Date Value Range Status  09/20/2012 11.5* 12.0 - 15.0 g/dL Final     HCT  Date Value Range Status  09/20/2012 33.7* 36.0 - 46.0 % Final    Physical Exam:  General: alert Lochia: appropriate Uterine Fundus: firm Incision: healing well   Discharge Diagnoses: Preelampsia and IUP at 34 weeks, breech  Discharge Information: Date: 09/20/2012 Activity: pelvic rest and no strenuous activity Diet: routine Medications: Percocet Condition: stable Instructions: refer to practice specific booklet Discharge to: home Follow-up Information   Follow up with Bing Plume, MD. Schedule an appointment as soon as possible for a visit in 4 days. (for staple removal and BP check)    Contact information:   [address], [address], [address] [place] [address] [phone]       Newborn Data: Live born female  Birth Weight: 4 lb 4.1 oz (1930 g) APGAR: 5, 9  Baby to stay in NICU.  Jeremia Groot D 09/20/2012, 9:33 AM

## 2012-09-20 NOTE — Progress Notes (Signed)
CSW met with pt briefly to assess her situation & offer support/resources, as needed.  Pt has several visitors present in her room but welcomed CSW talk in their presence.  Pt was very pleasant & seemed happy however not interested in CSW services.  The parents have all the necessary supplies for the infant & good family support.  The parents do not identify any CSW needs at this time.  CSW will continue to monitor & assist family as needed.

## 2012-09-20 NOTE — Progress Notes (Signed)
Discharge instructions reviewed with patient.  Patient states understanding of home care, medications, signs/symptoms to report to MD and signs/symptoms of increased blood pressure and activity.  No home equipment needed.  Patient discharged per wheelchair in stable condition with staff without incident.

## 2012-09-20 NOTE — Progress Notes (Signed)
POD #3 Doing well, baby stable, wants to go home Afeb, VSS, BP 140-160/70-80 Abd- soft, fundus firm, incision intact D/c home

## 2012-09-21 ENCOUNTER — Encounter (HOSPITAL_COMMUNITY): Payer: Self-pay | Admitting: *Deleted

## 2012-12-01 ENCOUNTER — Encounter (HOSPITAL_COMMUNITY): Payer: Self-pay | Admitting: Emergency Medicine

## 2012-12-01 ENCOUNTER — Emergency Department (HOSPITAL_COMMUNITY)
Admission: EM | Admit: 2012-12-01 | Discharge: 2012-12-01 | Disposition: A | Discharge status: Home / Self Care | Payer: BC Managed Care – PPO | Source: Home / Self Care

## 2012-12-01 DIAGNOSIS — T7840XA Allergy, unspecified, initial encounter: Secondary | ICD-10-CM

## 2012-12-01 DIAGNOSIS — R21 Rash and other nonspecific skin eruption: Secondary | ICD-10-CM

## 2012-12-01 MED ORDER — METHYLPREDNISOLONE SODIUM SUCC 125 MG IJ SOLR
INTRAMUSCULAR | Status: AC
  Filled 2012-12-01: qty 2

## 2012-12-01 MED ORDER — METHYLPREDNISOLONE SODIUM SUCC 125 MG IJ SOLR
125.0000 mg | Freq: Once | INTRAMUSCULAR | Status: AC
  Administered 2012-12-01: 125 mg via INTRAMUSCULAR

## 2012-12-01 NOTE — ED Provider Notes (Signed)
CSN: [id number]     Arrival date & time 12/01/12  1905 History   None    Chief Complaint  Patient presents with  . Poison Ivy   (Consider location/radiation/quality/duration/timing/severity/associated sxs/prior Treatment) HPI  Rash: on all extremitied and chest and neck. Started near ankles and forearms. First started Saturday evening. Worked in yard/brush on Friday. Very itchy and now painful. Denies difficulty breathing. Went to dermatologist and was given prednisone (3wk taper) but tdid not take as instructed to do with breakfast. Calamine lotion and benadryl lotion w/o benefit.    Past Medical History  Diagnosis Date  . Medical history non-contributory   . Seizures     last 32 years old  . [id number](478.2)    Past Surgical History  Procedure Laterality Date  . Cesarean section    . Cesarean section N/A 09/17/2012    Procedure: CESAREAN SECTION;  Surgeon: Bing Plume, MD;  Location: [facility];  Service: Obstetrics;  Laterality: N/A;   Family History  Problem Relation Age of Onset  . Diabetes Maternal Aunt   . Diabetes Maternal Grandmother   . Heart disease Paternal Grandfather    History  Substance Use Topics  . Smoking status: Never Smoker   . Smokeless tobacco: Not on file  . Alcohol Use: No   OB History   Grav Para Term Preterm Abortions TAB SAB Ect Mult Living   2 2  2      2      Review of Systems  Constitutional: Negative for fever and fatigue.  Respiratory: Negative for shortness of breath.   Cardiovascular: Negative for chest pain.  Skin: Positive for rash. Negative for color change, pallor and wound.  All other systems reviewed and are negative.    Allergies  Benadryl and Cat hair extract  Home Medications   Current Outpatient Rx  Name  Route  Sig  Dispense  Refill  . ALOE VERA EX   Apply externally   Apply topically.         . calamine lotion   Topical   Apply topically as needed.         Marland Kitchen oxyCODONE-acetaminophen (PERCOCET/ROXICET)  5-325 MG per tablet   Oral   Take 1-2 tablets by mouth every 4 (four) hours as needed.   30 tablet   0   . Prenatal Vit-Fe Fumarate-FA (PRENATAL MULTIVITAMIN) TABS   Oral   Take 1 tablet by mouth daily at 12 noon.          BP 145/89  Pulse 77  Temp(Src) 98.4 F (36.9 C) (Oral)  Resp 18  SpO2 100%  LMP 11/24/2012 Physical Exam  Constitutional: She is oriented to person, place, and time. She appears well-developed and well-nourished. No distress.  HENT:  Head: Normocephalic and atraumatic.  Eyes: EOM are normal. Pupils are equal, round, and reactive to light.  Neck: Normal range of motion. Neck supple.  Cardiovascular: Normal rate.   Pulmonary/Chest: Effort normal. No respiratory distress.  Abdominal: She exhibits no distension.  Musculoskeletal: Normal range of motion. She exhibits no edema.  Neurological: She is alert and oriented to person, place, and time.  Skin:  Diffuse erythematous vesicular rash on arms legs and trunk.   Psychiatric: She has a normal mood and affect. Her behavior is normal. Thought content normal.    ED Course  Procedures (including critical care time) Labs Review Labs Reviewed - No data to display Imaging Review No results found.  MDM   1. Rash  2. Allergy to poison ivy, initial encounter    32 yo F w/ systemic allergic reaction to poison ivy exposure. No respiratory compromise but feels like neck is starting to become involved. Did not start prednison (3wk taper dose pack) from dermatologist as she did not want to wait until the morning. 125mg  solumedrol IM now. - start prednisone taper pack in the morning. May do 10 days and stop or full 3 wks w/ taper if symptoms return. Pt concerned about taking for prolonged period - benadryl topical PRN - precautions given - all questions answered  Shelly Flatten, MD Family Medicine PGY-3 12/01/2012, 8:01 PM      Ozella Rocks, MD 12/01/12 2001

## 2012-12-01 NOTE — ED Provider Notes (Signed)
Medical screening examination/treatment/procedure(s) were performed by a resident physician and as supervising physician I was immediately available for consultation/collaboration.  Karris Deangelo, M.D.  Vencent Hauschild C Zykia Walla, MD 12/01/12 2056 

## 2012-12-01 NOTE — ED Notes (Signed)
Reports doing yard work and being exposed to poison ivy.  Onset 3 days ago.  Patient seen by a dermatologist in [place] this morning and prescribed prednisone-will start in am.  Also prescribed a spray ?name

## 2014-01-24 ENCOUNTER — Encounter (HOSPITAL_COMMUNITY): Payer: Self-pay | Admitting: Emergency Medicine

## 2015-04-08 ENCOUNTER — Emergency Department (HOSPITAL_COMMUNITY)
Admission: EM | Admit: 2015-04-08 | Discharge: 2015-04-08 | Disposition: A | Discharge status: Home / Self Care | Payer: BLUE CROSS/BLUE SHIELD | Source: Home / Self Care | Attending: Family Medicine | Admitting: Family Medicine

## 2015-04-08 ENCOUNTER — Encounter (HOSPITAL_COMMUNITY): Payer: Self-pay | Admitting: Emergency Medicine

## 2015-04-08 DIAGNOSIS — J029 Acute pharyngitis, unspecified: Secondary | ICD-10-CM | POA: Diagnosis not present

## 2015-04-08 DIAGNOSIS — L740 Miliaria rubra: Secondary | ICD-10-CM | POA: Diagnosis not present

## 2015-04-08 DIAGNOSIS — R0982 Postnasal drip: Secondary | ICD-10-CM

## 2015-04-08 DIAGNOSIS — T700XXA Otitic barotrauma, initial encounter: Secondary | ICD-10-CM | POA: Diagnosis not present

## 2015-04-08 NOTE — ED Notes (Signed)
C/o rash around groin area that has become painful onset yest Also c/o jaw pain onset today A&O x4... No acute distress.

## 2015-04-08 NOTE — ED Provider Notes (Signed)
CSN: [id number]     Arrival date & time 04/08/15  1936 History   First MD Initiated Contact with Patient 04/08/15 2055     Chief Complaint  Patient presents with  . Rash  . Jaw Pain   (Consider location/radiation/quality/duration/timing/severity/associated sxs/prior Treatment) HPI Comments: It began 35 year old female presents with multiple complaints. She states that she has developed a rash to the medial thighs and groin for the past couple of days. The redness has extended from just the most proximal areas to the upper third of the thighs. These are well marginated, they match when the legs are placed together. No drainage. She states it is more tender and not as itchy. She states that it occurred after she had been on an exercise bike. It began a couple of days after she had been exercising.  Second complaint is that of pain behind the right jaw. The pain occurs primarily during swallowing. No ear pain. No trauma. Third complaint is that of a sore throat. It started in the past 3-4 days. Fourth concern is that of some swelling in the hands for the past 3-4 days.   Past Medical History  Diagnosis Date  . Medical history non-contributory   . Seizures (HCC)     last 35 years old  . [id number](469.6Headache(784.0)    Past Surgical History  Procedure Laterality Date  . Cesarean section    . Cesarean section N/A 09/17/2012    Procedure: CESAREAN SECTION;  Surgeon: Bing Plumehomas F Henley, MD;  Location: [facility];  Service: Obstetrics;  Laterality: N/A;   Family History  Problem Relation Age of Onset  . Diabetes Maternal Aunt   . Diabetes Maternal Grandmother   . Heart disease Paternal Grandfather    Social History  Substance Use Topics  . Smoking status: Never Smoker   . Smokeless tobacco: None  . Alcohol Use: No   OB History    Gravida Para Term Preterm AB TAB SAB Ectopic Multiple Living   2 2  2      2      Review of Systems  Constitutional: Negative for fever, activity change and fatigue.   HENT: Negative.   Eyes: Negative.   Respiratory: Negative.  Negative for cough and shortness of breath.   Cardiovascular: Negative for chest pain and leg swelling.  Genitourinary: Negative.   Musculoskeletal: Negative.   Skin: Positive for color change and rash. Negative for wound.  Neurological: Negative.   All other systems reviewed and are negative.   Allergies  Benadryl and Cat hair extract  Home Medications   Prior to Admission medications   Medication Sig Start Date End Date Taking? Authorizing Provider  ALOE VERA EX Apply topically.    Historical Provider, MD  calamine lotion Apply topically as needed.    Historical Provider, MD  oxyCODONE-acetaminophen (PERCOCET/ROXICET) 5-325 MG per tablet Take 1-2 tablets by mouth every 4 (four) hours as needed. 09/20/12   Lavina Hammanodd Meisinger, MD  Prenatal Vit-Fe Fumarate-FA (PRENATAL MULTIVITAMIN) TABS Take 1 tablet by mouth daily at 12 noon.    Historical Provider, MD   Meds Ordered and Administered this Visit  Medications - No data to display  BP 134/94 mmHg  Pulse 92  Temp(Src) 97.9 F (36.6 C) (Oral)  Resp 18  SpO2 98%  LMP 03/28/2015 No data found.   Physical Exam  Constitutional: She is oriented to person, place, and time. She appears well-developed and well-nourished. No distress.  HENT:  Head: Normocephalic and atraumatic.  Mouth/Throat: No oropharyngeal  exudate.  Bilateral TMs are normal. Oropharynx with clear PND and mild cobblestoning and mild erythema. No exudates. No intraoral swelling or edema.  Eyes: Conjunctivae and EOM are normal.  Neck: Normal range of motion. Neck supple.  Cardiovascular: Normal rate, regular rhythm and normal heart sounds.   Pulmonary/Chest: Effort normal and breath sounds normal. No respiratory distress.  Abdominal: Soft. There is no tenderness.  Musculoskeletal: Normal range of motion. She exhibits edema. She exhibits no tenderness.  Mild swelling to the digits.  Lymphadenopathy:    She  has no cervical adenopathy.  Neurological: She is alert and oriented to person, place, and time. She exhibits normal muscle tone.  Skin: Skin is warm and dry. Rash noted. There is erythema.  Erythematous and primarily macular rash around the suprapubic area, bilateral groin/inguinal area medial thighs and small portion of the proximal anterior thighs. The rash is symmetric he comes together where the skin folds of both lower extremities meet.  Psychiatric: She has a normal mood and affect. Her behavior is normal.  Nursing note and vitals reviewed.   ED Course  Procedures (including critical care time)  Labs Review Labs Reviewed - No data to display  Imaging Review No results found.   Visual Acuity Review  Right Eye Distance:   Left Eye Distance:   Bilateral Distance:    Right Eye Near:   Left Eye Near:    Bilateral Near:         MDM   1. Barotitis media, initial encounter   2. PND (post-nasal drip)   3. Sore throat   4. Heat rash    the patient states that she has pain behind the right mandible particularly with swallowing. This most likely has to do with eustachian tube dysfunction associated with the PND. The TMs appear normal. No erythema. No bulging and no retraction. The rash is likely due to the scan rubbing together while she was exercising on the bicycle. Cool compresses to rash vaseline intensive care lotion, if worse or makes it worse wash off Sudafed and antihistamine for drainage and ear pressure.     Hayden Rasmussen, NP 04/08/15 2135

## 2015-04-08 NOTE — Discharge Instructions (Signed)
Barotitis Media Take antihistamine like Claritin or Allegra for draiange, Sudafed PE for congestion Barotitis media is inflammation of your middle ear. This occurs when the auditory tube (eustachian tube) leading from the back of your nose (nasopharynx) to your eardrum is blocked. This blockage may result from a cold, environmental allergies, or an upper respiratory infection. Unresolved barotitis media may lead to damage or hearing loss (barotrauma), which may become permanent. HOME CARE INSTRUCTIONS   Use medicines as recommended by your health care provider. Over-the-counter medicines will help unblock the canal and can help during times of air travel.  Do not put anything into your ears to clean or unplug them. Eardrops will not be helpful.  Do not swim, dive, or fly until your health care provider says it is all right to do so. If these activities are necessary, chewing gum with frequent, forceful swallowing may help. It is also helpful to hold your nose and gently blow to pop your ears for equalizing pressure changes. This forces air into the eustachian tube.  Only take over-the-counter or prescription medicines for pain, discomfort, or fever as directed by your health care provider.  A decongestant may be helpful in decongesting the middle ear and make pressure equalization easier. SEEK MEDICAL CARE IF:  You experience a serious form of dizziness in which you feel as if the room is spinning and you feel nauseated (vertigo).  Your symptoms only involve one ear. SEEK IMMEDIATE MEDICAL CARE IF:   You develop a severe headache, dizziness, or severe ear pain.  You have bloody or pus-like drainage from your ears.  You develop a fever.  Your problems do not improve or become worse. MAKE SURE YOU:   Understand these instructions.  Will watch your condition.  Will get help right away if you are not doing well or get worse.   This information is not intended to replace advice given  to you by your health care provider. Make sure you discuss any questions you have with your health care provider.   Document Released: 03/08/2000 Document Revised: 12/30/2012 Document Reviewed: 10/06/2012 Elsevier Interactive Patient Education 2016 [employer]  Sore Throat A sore throat is pain, burning, irritation, or scratchiness of the throat. There is often pain or tenderness when swallowing or talking. A sore throat may be accompanied by other symptoms, such as coughing, sneezing, fever, and swollen neck glands. A sore throat is often the first sign of another sickness, such as a cold, flu, strep throat, or mononucleosis (commonly known as mono). Most sore throats go away without medical treatment. CAUSES  The most common causes of a sore throat include:  A viral infection, such as a cold, flu, or mono.  A bacterial infection, such as strep throat, tonsillitis, or whooping cough.  Seasonal allergies.  Dryness in the air.  Irritants, such as smoke or pollution.  Gastroesophageal reflux disease (GERD). HOME CARE INSTRUCTIONS   Only take over-the-counter medicines as directed by your caregiver.  Drink enough fluids to keep your urine clear or pale yellow.  Rest as needed.  Try using throat sprays, lozenges, or sucking on hard candy to ease any pain (if older than 4 years or as directed).  Sip warm liquids, such as broth, herbal tea, or warm water with honey to relieve pain temporarily. You may also eat or drink cold or frozen liquids such as frozen ice pops.  Gargle with salt water (mix 1 tsp salt with 8 oz of water).  Do not smoke and  avoid secondhand smoke.  Put a cool-mist humidifier in your bedroom at night to moisten the air. You can also turn on a hot shower and sit in the bathroom with the door closed for 5-10 minutes. SEEK IMMEDIATE MEDICAL CARE IF:  You have difficulty breathing.  You are unable to swallow fluids, soft foods, or your saliva.  You have  increased swelling in the throat.  Your sore throat does not get better in 7 days.  You have nausea and vomiting.  You have a fever or persistent symptoms for more than 2-3 days.  You have a fever and your symptoms suddenly get worse. MAKE SURE YOU:   Understand these instructions.  Will watch your condition.  Will get help right away if you are not doing well or get worse.   This information is not intended to replace advice given to you by your health care provider. Make sure you discuss any questions you have with your health care provider.   Document Released: 04/18/2004 Document Revised: 04/01/2014 Document Reviewed: 11/17/2011 Elsevier Interactive Patient Education 2016 [employer] Inc[employer]  Heat Rash, Adult Heat rash (miliaria) is a skin condition that causes a rash of little red bumps around the openings to your sweat glands on your skin. It happens when sweat glands are blocked. There are four types of heat rash:  Miliaria crystallina. This is the mildest version. It causes small, clear blisters (vesicles) on the skin.  Miliaria rubra. In this type of heat rash, the ducts are blocked deeper in the skin. The rash is very itchy and consists of small bumps (papules). There may also be a fever.  Miliaria pustulosa. This rash is similar to miliaria rubra, but the bumps are pimples (pustules) rather than papules.  Miliaria profunda. The sweat ducts are blocked deep in the skin. Sometimes flesh-colored papules develop on the skin. This type can lead to heat exhaustion. It requires immediate medical attention. Most cases of heat rash can be managed easily. CAUSES  Heat rash is caused by blocked sweat glands. The sweat glands can become blocked by:  Wearing heavy or tight clothing during exercise or extreme heat, especially in areas of skin rubbing together such as the arm pits.  Having a fever and sweating while in bed.  Certain medicines. RISK FACTORS This condition is more  likely to develop:  In people who live or work in hot, humid climates.  During periods of intense exercise and sweating.  In people who are not accustomed to heat. SYMPTOMS  Symptoms of heat rash include:   Small vesicles that break open easily. These vesicles are usually found on the trunk of the body.  Clusters of itchy, red papules or pustules. These often develop on the neck and upper chest, in the groin, under the breasts, and in elbow creases. DIAGNOSIS  Your health care provider may suspect heat rash based on your symptoms. Your health care provider will also do a physical exam. This may include taking a sample of the fluid from your vesicles or pustules for testing. TREATMENT  Moving to a cool, dry place is the best treatment for heat rash. Treatment may also include medicines, such as:  Corticosteroid creams for skin irritation.  Antibiotic medicines for an infection. HOME CARE INSTRUCTIONS  Skin care  Keep the affected area dry.  Do not use ointments or creams. These can make the condition worse.  Apply cool compresses to the affected areas.  Do not scratch your skin.  Do not take hot  showers or baths. General Instructions  Take over-the-counter and prescription medicines only as told by your health care provider.  If you were prescribed an antibiotic, take it as told by your health care provider. Do not stop taking it even if your condition improves.  Stay in a cool room as much as possible. Use an air conditioner or fan, if possible.  Do not wear tight clothes. Wear comfortable, loose-fitting clothing.  Keep all follow-up visits as told by your health care provider. This is important. SEEK MEDICAL CARE IF:   Your heat rash does not go away within several days.  Your heat rash gets worse.  You have a fever or chills. SEEK IMMEDIATE MEDICAL CARE IF:   You are having cramps or muscle contractions.  You have chest pain.  You have trouble  breathing.  You faint.   This information is not intended to replace advice given to you by your health care provider. Make sure you discuss any questions you have with your health care provider.   Document Released: 02/27/2009 Document Revised: 11/30/2014 Document Reviewed: 07/27/2014 Elsevier Interactive Patient Education [employer]

## 2015-12-28 ENCOUNTER — Ambulatory Visit
Admission: RE | Admit: 2015-12-28 | Discharge: 2015-12-28 | Disposition: A | Discharge status: Home / Self Care | Payer: BLUE CROSS/BLUE SHIELD | Source: Ambulatory Visit | Attending: Family Medicine | Admitting: Family Medicine

## 2015-12-28 ENCOUNTER — Other Ambulatory Visit: Payer: Self-pay | Admitting: Family Medicine

## 2015-12-28 DIAGNOSIS — R0602 Shortness of breath: Secondary | ICD-10-CM | POA: Diagnosis present

## 2015-12-28 MED ORDER — IOPAMIDOL (ISOVUE-370) INJECTION 76%
75.0000 mL | Freq: Once | INTRAVENOUS | Status: AC | PRN
  Administered 2015-12-28: 75 mL via INTRAVENOUS

## 2016-11-29 IMAGING — CT CT ANGIO CHEST
2 of 6 series · 19 of 46 positions shown · IV contrast (APPLIED)
Comparison: None.

CLINICAL DATA: Shortness of breath and chest spasms since last
night.

EXAM:
CT ANGIOGRAPHY CHEST WITH CONTRAST
TECHNIQUE: Multidetector CT imaging of the chest was performed using the
standard protocol during bolus administration of intravenous
contrast. Multiplanar CT image reconstructions and MIPs were
obtained to evaluate the vascular anatomy.
CONTRAST:  75 cc Isovue 370

[Series 5: thins · axial · 0.76mm/px · z∈[-527,-331]mm · 17 of 216 slices shown]
[im 10/216  lung]
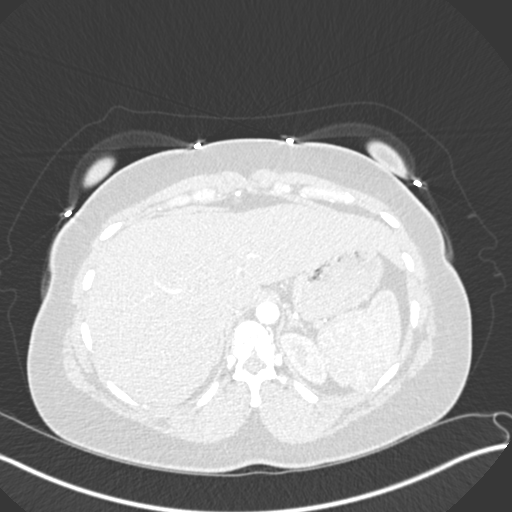
[im 19/216  soft-tissue]
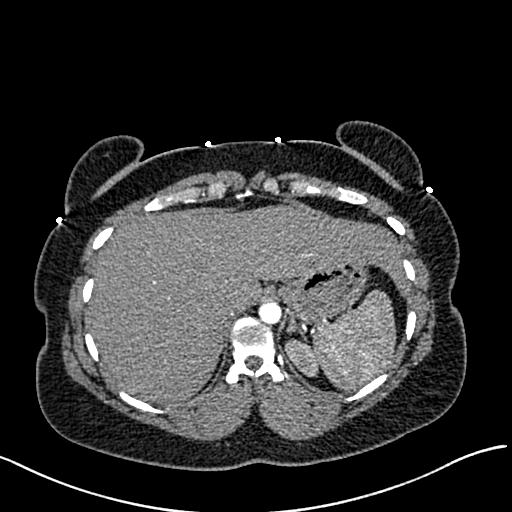
[im 38/216  lung]
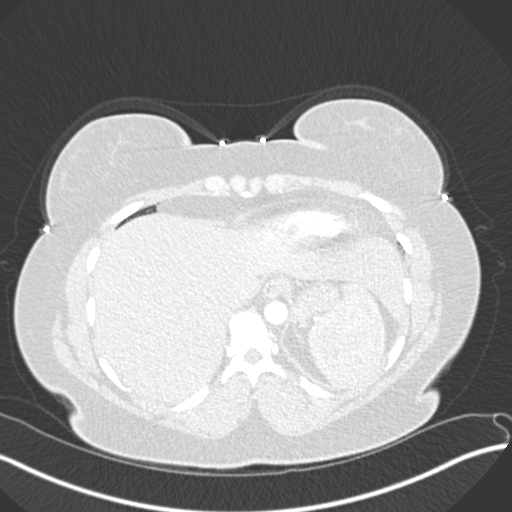
[im 47/216  soft-tissue]
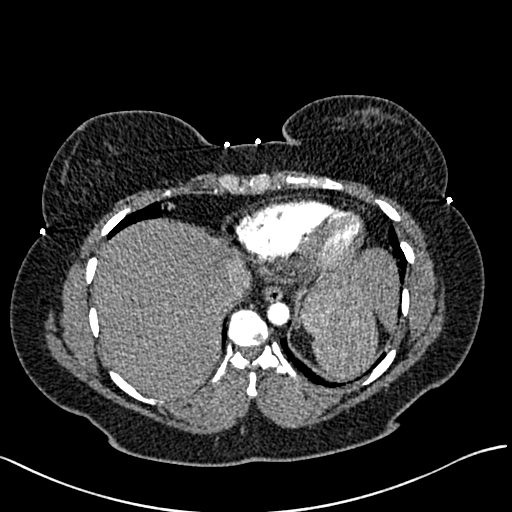
[im 57/216  lung]
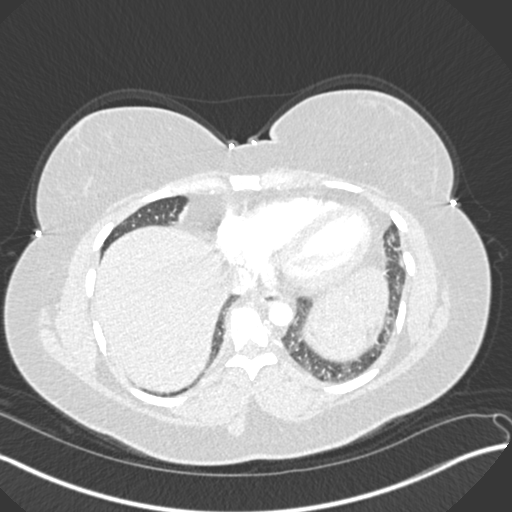
[im 75/216  soft-tissue]
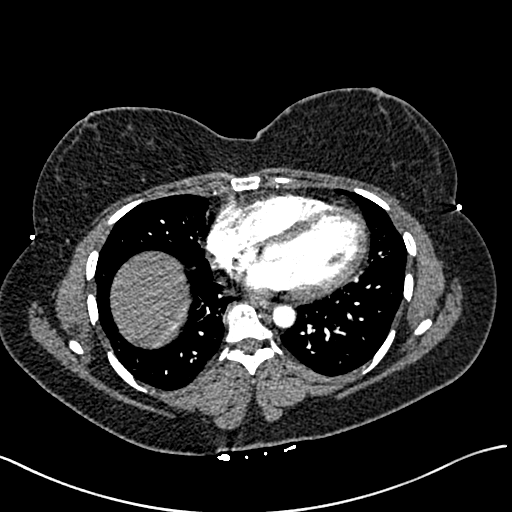
[im 85/216  lung]
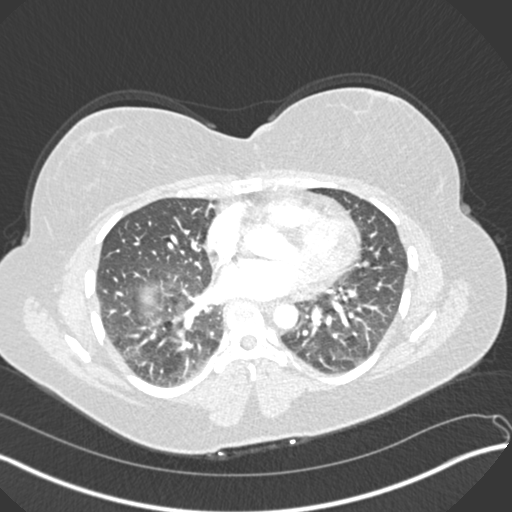
[im 94/216  soft-tissue]
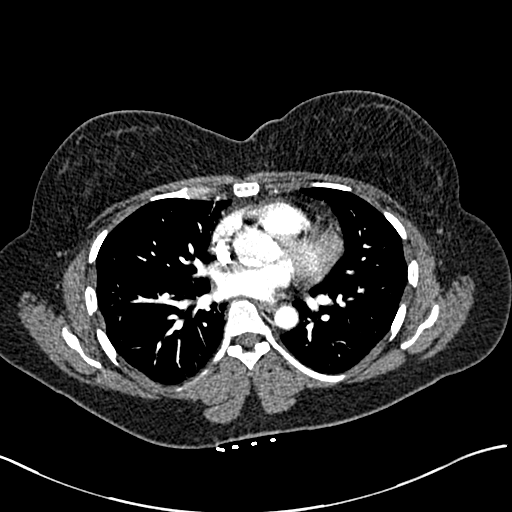
[im 113/216  lung]
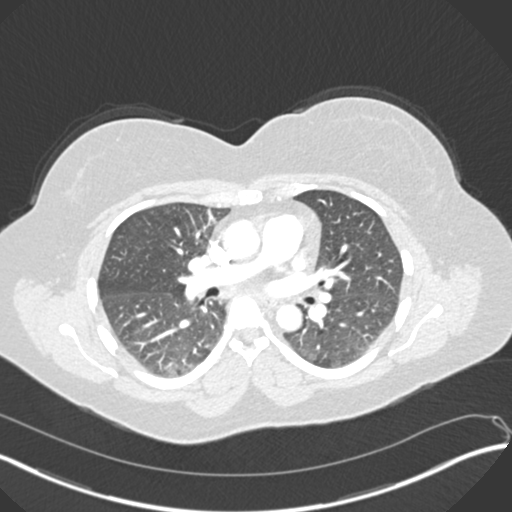
[im 122/216  soft-tissue]
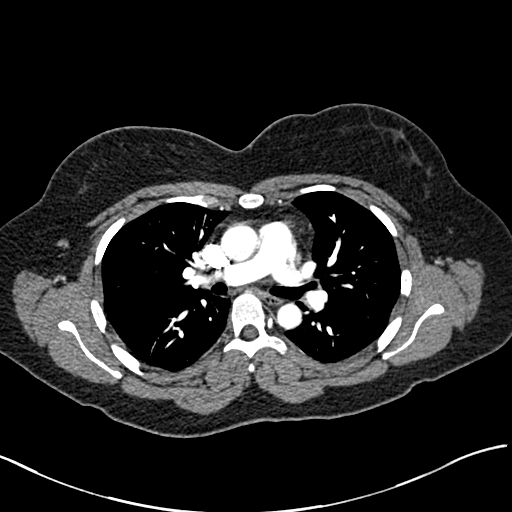
[im 131/216  lung]
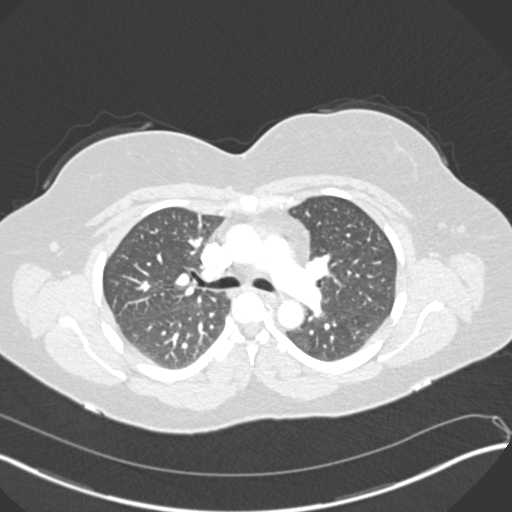
[im 141/216  soft-tissue]
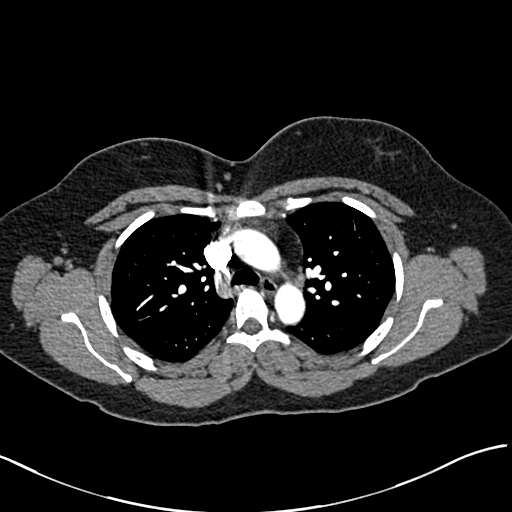
[im 159/216  lung]
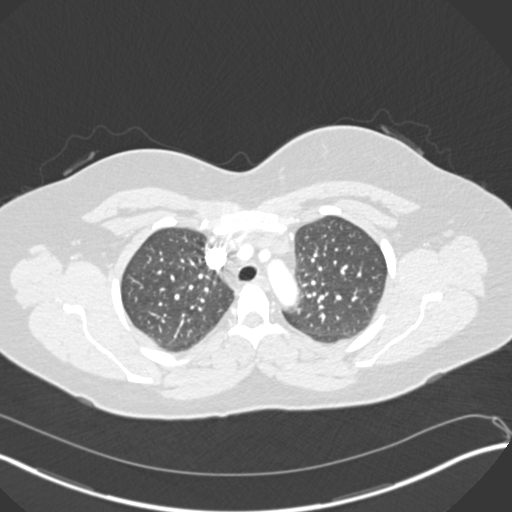
[im 169/216  soft-tissue]
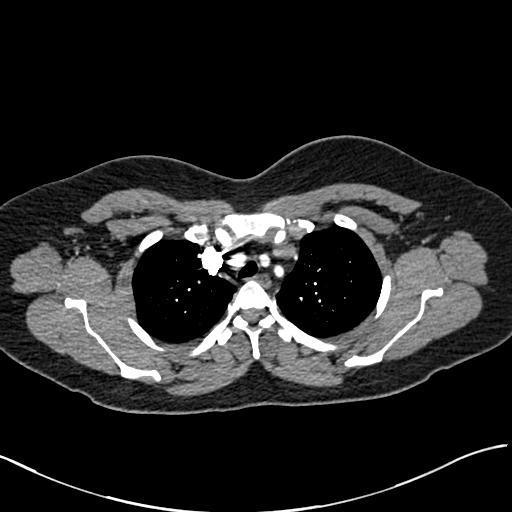
[im 178/216  lung]
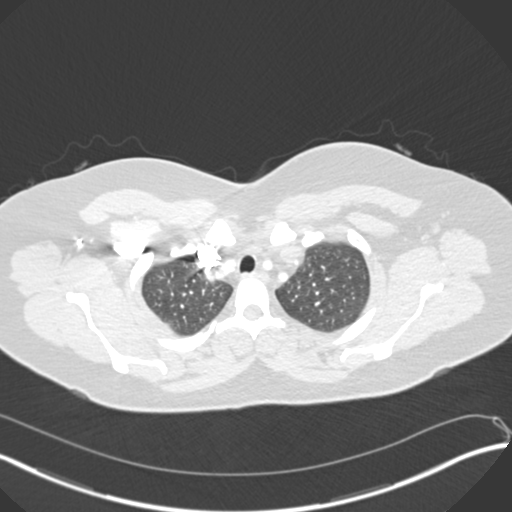
[im 197/216  soft-tissue]
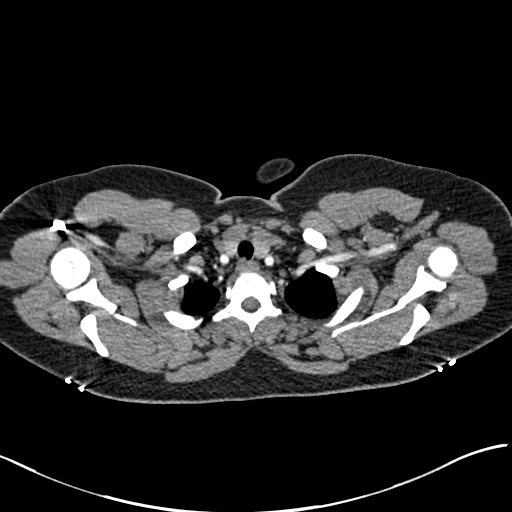
[im 206/216  lung]
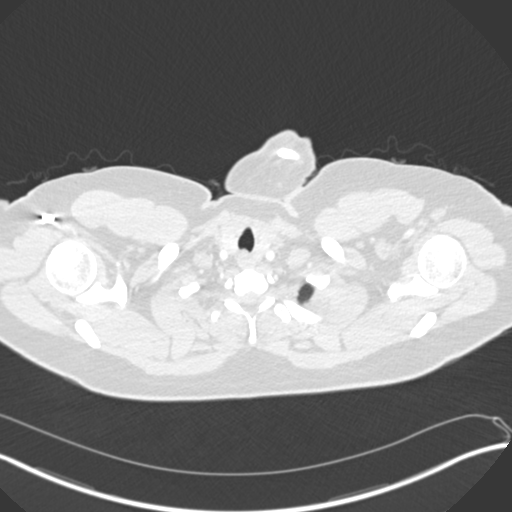

[Series 7: coronal mpr · coronal · 0.44mm/px · 2 of 88 slices shown]
[im 30/88  soft-tissue]
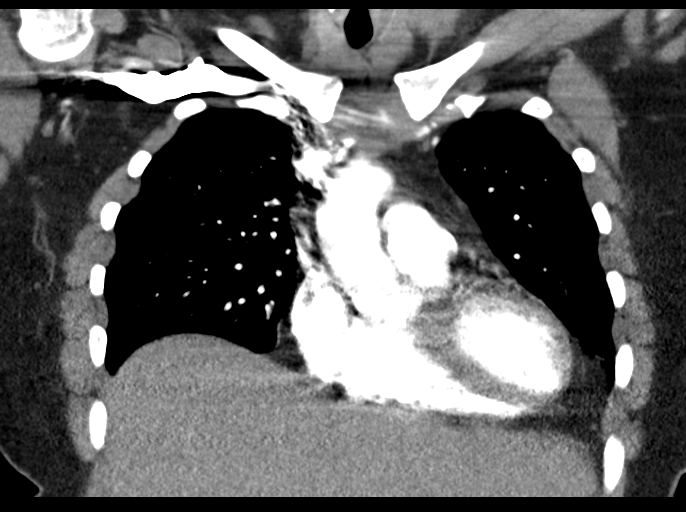
[im 59/88  soft-tissue]
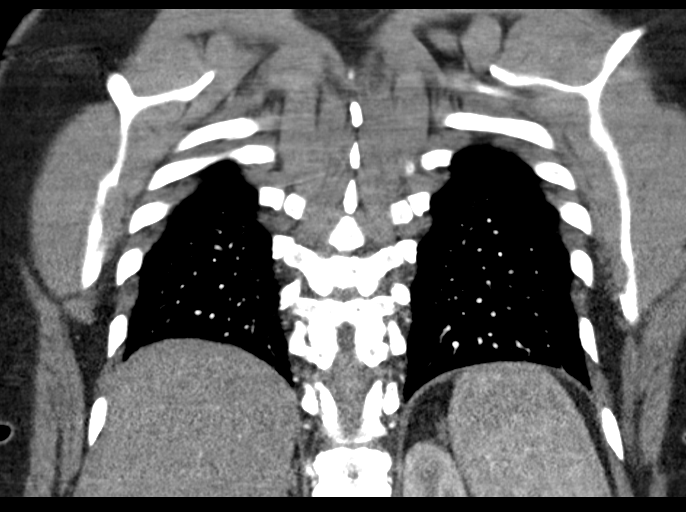

[19 of 46 positions shown; findings below may reference images not displayed]

FINDINGS: Cardiovascular: Pulmonary arterial opacification is good. There are
no pulmonary emboli. No aortic atherosclerosis, aneurysm or
dissection. Heart size is normal. No pericardial fluid.

Mediastinum/Nodes: Normal

Lungs/Pleura: Normal

Upper Abdomen: Normal

Musculoskeletal: Normal

Review of the MIP images confirms the above findings.
IMPRESSION: Normal examination. No cause of the presenting symptoms is
identified. No pulmonary emboli.

These results will be called to the ordering clinician or
representative by the Radiologist Assistant, and communication
documented in the PACS or zVision Dashboard.
